# Patient Record
Sex: Male | Born: 1980 | Race: White | Hispanic: No | Marital: Married | Smoking: Never smoker
Health system: Southern US, Community
[De-identification: ages and names within clinical notes are randomized; demographics above are authoritative.]

## PROBLEM LIST (undated history)

## (undated) DIAGNOSIS — M359 Systemic involvement of connective tissue, unspecified: Secondary | ICD-10-CM

## (undated) DIAGNOSIS — M109 Gout, unspecified: Secondary | ICD-10-CM

## (undated) HISTORY — PX: ELBOW SURGERY: SHX618

## (undated) HISTORY — PX: ABDOMINAL SURGERY: SHX537

---

## 2008-08-31 ENCOUNTER — Ambulatory Visit: Payer: Self-pay | Admitting: Podiatry

## 2012-04-03 ENCOUNTER — Emergency Department: Payer: Self-pay | Admitting: *Deleted

## 2013-09-20 ENCOUNTER — Encounter: Payer: Self-pay | Admitting: Internal Medicine

## 2013-10-02 ENCOUNTER — Ambulatory Visit: Payer: Self-pay | Admitting: Internal Medicine

## 2014-08-18 ENCOUNTER — Ambulatory Visit: Payer: Self-pay | Admitting: Surgery

## 2017-05-14 ENCOUNTER — Emergency Department
Admission: EM | Admit: 2017-05-14 | Discharge: 2017-05-14 | Disposition: A | Discharge status: Home / Self Care | Payer: 59 | Attending: Emergency Medicine | Admitting: Emergency Medicine

## 2017-05-14 ENCOUNTER — Emergency Department: Payer: 59

## 2017-05-14 DIAGNOSIS — R110 Nausea: Secondary | ICD-10-CM | POA: Insufficient documentation

## 2017-05-14 DIAGNOSIS — Z79899 Other long term (current) drug therapy: Secondary | ICD-10-CM | POA: Diagnosis not present

## 2017-05-14 DIAGNOSIS — R1013 Epigastric pain: Secondary | ICD-10-CM | POA: Insufficient documentation

## 2017-05-14 DIAGNOSIS — R1010 Upper abdominal pain, unspecified: Secondary | ICD-10-CM

## 2017-05-14 HISTORY — DX: Gout, unspecified: M10.9

## 2017-05-14 LAB — COMPREHENSIVE METABOLIC PANEL
ALT: 38 U/L (ref 17–63)
AST: 24 U/L (ref 15–41)
Albumin: 4.3 g/dL (ref 3.5–5.0)
Alkaline Phosphatase: 69 U/L (ref 38–126)
Anion gap: 8 (ref 5–15)
BUN: 12 mg/dL (ref 6–20)
CHLORIDE: 107 mmol/L (ref 101–111)
CO2: 26 mmol/L (ref 22–32)
CREATININE: 0.78 mg/dL (ref 0.61–1.24)
Calcium: 9.6 mg/dL (ref 8.9–10.3)
GFR calc non Af Amer: >60 mL/min (ref >60)
Glucose, Bld: 101 mg/dL — ABNORMAL HIGH (ref 65–99)
Potassium: 4 mmol/L (ref 3.5–5.1)
SODIUM: 141 mmol/L (ref 135–145)
Total Bilirubin: 0.6 mg/dL (ref 0.3–1.2)
Total Protein: 7.3 g/dL (ref 6.5–8.1)

## 2017-05-14 LAB — CBC
HCT: 46 % (ref 40.0–52.0)
HEMOGLOBIN: 16.3 g/dL (ref 13.0–18.0)
MCH: 33.1 pg (ref 26.0–34.0)
MCHC: 35.4 g/dL (ref 32.0–36.0)
MCV: 93.6 fL (ref 80.0–100.0)
PLATELETS: 335 10*3/uL (ref 150–440)
RBC: 4.92 MIL/uL (ref 4.40–5.90)
RDW: 13.5 % (ref 11.5–14.5)
WBC: 6.1 10*3/uL (ref 3.8–10.6)

## 2017-05-14 LAB — LIPASE, BLOOD: Lipase: 30 U/L (ref 11–51)

## 2017-05-14 LAB — TROPONIN I: Troponin I: <0.03 ng/mL (ref <0.03)

## 2017-05-14 MED ORDER — IOPAMIDOL (ISOVUE-300) INJECTION 61%
100.0000 mL | Freq: Once | INTRAVENOUS | Status: AC | PRN
  Administered 2017-05-14: 100 mL via INTRAVENOUS

## 2017-05-14 MED ORDER — OMEPRAZOLE 40 MG PO CPDR: 40.0000 mg | DELAYED_RELEASE_CAPSULE | Freq: Every day | ORAL | 0 refills | Status: AC

## 2017-05-14 MED ORDER — CVS PROBIOTIC (LACTOBACILLUS) PO CAPS: 1.0000 | ORAL_CAPSULE | Freq: Every day | ORAL | 0 refills | Status: DC

## 2017-05-14 MED ORDER — IOPAMIDOL (ISOVUE-300) INJECTION 61%
30.0000 mL | Freq: Once | INTRAVENOUS | Status: AC | PRN
  Administered 2017-05-14: 30 mL via ORAL

## 2017-05-14 NOTE — ED Notes (Signed)
Pt returned from CT, resting in bed in no distress 

## 2017-05-14 NOTE — ED Triage Notes (Signed)
Pt c/o epigastric pain intermittently since July, bloating, heartburn, indigestion. Denies NVD. Pt alert and oriented X4, active, cooperative, pt in NAD. RR even and unlabored, color WNL.

## 2017-05-14 NOTE — ED Provider Notes (Signed)
[facility] Emergency Department Provider Note ____________________________________________   First MD Initiated Contact with Patient 05/14/17 1010     (approximate)  I have reviewed the triage vital signs and the nursing notes.   HISTORY  Chief Complaint Abdominal Pain    HPI Kevin Davidson is a 36 y.o. male with a past history of gout who presents with left sided and epigastric abdominal pain for the last 3 months, starting after he had an appendectomy on July 6, and intermittent.  It is worse after eating and associated with feeling very full after he eats even a small amount and also associated with constipation although he states he had a normal bowel movement this morning. Patient reports intermittent nausea but no vomiting. He denies fever or chills, diarrhea, or any urinary symptoms. No swelling of the abdomen. Patient is passing gas normally.  Past Medical History:  Diagnosis Date  . Gout     There are no active problems to display for this patient.   Past Surgical History:  Procedure Laterality Date  . ABDOMINAL SURGERY    . ELBOW SURGERY      Prior to Admission medications   Medication Sig Start Date End Date Taking? Authorizing Provider  allopurinol (ZYLOPRIM) 300 MG tablet Take 300 mg by mouth daily. 04/04/17  Yes [provider]  HYDROcodone-acetaminophen (NORCO/VICODIN) 5-325 MG tablet Take 1 tablet by mouth every 4 (four) hours as needed. 03/09/17   [provider]  Lactobacillus Rhamnosus, GG, ([employer] PROBIOTIC, LACTOBACILLUS,) CAPS Take 1-2 capsules by mouth daily. 05/14/17   Arta Silence, MD  omeprazole (PRILOSEC) 40 MG capsule Take 1 capsule (40 mg total) by mouth daily. 05/14/17 05/14/18  Arta Silence, MD    Allergies Patient has no known allergies.  No family history on file.  Social History Social History  Substance Use Topics  . Smoking status: Never Smoker  . Smokeless tobacco: Not on file  .  Alcohol use No    Review of Systems  Constitutional: No fever/chills Eyes: No visual changes. ENT: No sore throat. Cardiovascular: Denies chest pain. Respiratory: Denies shortness of breath. Gastrointestinal: Positive for nausea, no vomiting.  No diarrhea.  Genitourinary: Negative for dysuria.  Musculoskeletal: Negative for back pain. Skin: Negative for rash. Neurological: Negative for headaches, focal weakness or numbness.   ____________________________________________   PHYSICAL EXAM:  VITAL SIGNS: ED Triage Vitals  Enc Vitals Group     BP 05/14/17 0848 126/84     Pulse Rate 05/14/17 0848 67     Resp 05/14/17 0848 16     Temp 05/14/17 0848 97.9 F (36.6 C)     Temp Source 05/14/17 0848 Oral     SpO2 05/14/17 0848 98 %     Weight 05/14/17 0849 238 lb (108 kg)     Height 05/14/17 0849 6' (1.829 m)     Head Circumference --      Peak Flow --      Pain Score 05/14/17 0848 5     Pain Loc --      Pain Edu? --      Excl. in [place]? --     Constitutional: Alert and oriented. Well appearing and in no acute distress. Eyes: Conjunctivae are normal.  Head: Atraumatic. Nose: No congestion/rhinnorhea. Mouth/Throat: Mucous membranes are moist.   Neck: Normal range of motion.  Cardiovascular: Good peripheral circulation. Respiratory: Normal respiratory effort.  No retractions.  Gastrointestinal: Soft and nontender. Minimal LUQ discomfort to deep palpation. No distention.  Genitourinary: No CVA tenderness. Musculoskeletal:  Extremities warm and well perfused.  Neurologic:  Normal speech and language. No gross focal neurologic deficits are appreciated.  Skin:  Skin is warm and dry. No rash noted. Psychiatric: Mood and affect are normal. Speech and behavior are normal.  ____________________________________________   LABS (all labs ordered are listed, but only abnormal results are displayed)  Labs Reviewed  COMPREHENSIVE METABOLIC PANEL - Abnormal; Notable for the following:        Result Value   Glucose, Bld 101 (*)    All other components within normal limits  LIPASE, BLOOD  CBC  TROPONIN I  URINALYSIS, COMPLETE (UACMP) WITH MICROSCOPIC   ____________________________________________  EKG  ED ECG REPORT I, Arta Silence, the attending physician, personally viewed and interpreted this ECG.  Date: 05/14/2017 EKG Time: 849 Rate: 63 Rhythm: normal sinus rhythm QRS Axis: normal Intervals: normal ST/T Wave abnormalities: normal Narrative Interpretation: no evidence of acute ischemia  ____________________________________________  RADIOLOGY  CT abd with no SBO or other acute findings.    ____________________________________________   PROCEDURES  Procedure(s) performed: No    Critical Care performed: No ____________________________________________   INITIAL IMPRESSION / ASSESSMENT AND PLAN / ED COURSE  Pertinent labs & imaging results that were available during my care of the patient were reviewed by me and considered in my medical decision making (see chart for details).  36 year old male presents with 3 months of intermittent left upper quadrant abdominal pain starting after he had an appendectomy. Patient reports feeling early satiety and has had some constipation although reports normal bowel movement today. Vital signs are normal, patient is comfortable appearing, and exam is as described with minimal left upper quadrant discomfort to deep palpation. Overall suspect most likely gastritis versus PUD, and some of the symptoms also may be due to chronic constipation or changes in gut flora after the surgery and antibiotics. There is no clinical evidence for SBO or other acute postsurgical complication. I had extensive discussion with patient; he is very concerned about complications from the surgery and based on shared decision making he would strongly prefer to obtain a CT to definitively rule out any such acute cause of his pain. If CT  negative anticipate discharge home with GI referral.    ----------------------------------------- 12:31 PM on 05/14/2017 -----------------------------------------  CT is negative. Patient continues to appear comfortable. I will discharge with PPI as well as probiotics. Patient given return precautions and instructed to follow-up with a gastroenterologist. Patient feels comfortable with the discharge plan and feels well to go home.  ____________________________________________   FINAL CLINICAL IMPRESSION(S) / ED DIAGNOSES  Final diagnoses:  Pain of upper abdomen      NEW MEDICATIONS STARTED DURING THIS VISIT:  New Prescriptions   LACTOBACILLUS RHAMNOSUS, GG, ([employer] PROBIOTIC, LACTOBACILLUS,) CAPS    Take 1-2 capsules by mouth daily.   OMEPRAZOLE (PRILOSEC) 40 MG CAPSULE    Take 1 capsule (40 mg total) by mouth daily.     Note:  This document was prepared using Dragon voice recognition software and may include unintentional dictation errors.    Arta Silence, MD 05/14/17 1236

## 2017-05-14 NOTE — ED Notes (Signed)
EDP at bedside  

## 2017-05-14 NOTE — Discharge Instructions (Signed)
Return to the ER for new or worsening abdominal pain, constant abdominal pain, persistent vomiting or inability to tolerate anything by mouth, fever or chills, not having bowel movements or passing gas, distention of your abdomen, or any other new or worsening symptoms that concern you. Follow-up with a gastroenterologist within the next several weeks.

## 2017-08-23 ENCOUNTER — Other Ambulatory Visit: Payer: Self-pay | Admitting: Physician Assistant

## 2017-08-23 DIAGNOSIS — R748 Abnormal levels of other serum enzymes: Secondary | ICD-10-CM

## 2017-09-03 ENCOUNTER — Ambulatory Visit
Admission: RE | Admit: 2017-09-03 | Discharge: 2017-09-03 | Disposition: A | Discharge status: Home / Self Care | Payer: 59 | Source: Ambulatory Visit | Attending: Physician Assistant | Admitting: Physician Assistant

## 2017-09-03 DIAGNOSIS — R748 Abnormal levels of other serum enzymes: Secondary | ICD-10-CM | POA: Insufficient documentation

## 2017-09-03 DIAGNOSIS — R109 Unspecified abdominal pain: Secondary | ICD-10-CM | POA: Diagnosis not present

## 2017-09-03 DIAGNOSIS — R932 Abnormal findings on diagnostic imaging of liver and biliary tract: Secondary | ICD-10-CM | POA: Insufficient documentation

## 2017-09-03 DIAGNOSIS — K7689 Other specified diseases of liver: Secondary | ICD-10-CM | POA: Diagnosis not present

## 2018-12-11 ENCOUNTER — Encounter: Payer: Self-pay | Admitting: Emergency Medicine

## 2018-12-11 ENCOUNTER — Emergency Department
Admission: EM | Admit: 2018-12-11 | Discharge: 2018-12-11 | Disposition: A | Discharge status: Home / Self Care | Payer: 59 | Attending: Emergency Medicine | Admitting: Emergency Medicine

## 2018-12-11 ENCOUNTER — Other Ambulatory Visit: Payer: Self-pay

## 2018-12-11 DIAGNOSIS — E8809 Other disorders of plasma-protein metabolism, not elsewhere classified: Secondary | ICD-10-CM | POA: Diagnosis not present

## 2018-12-11 DIAGNOSIS — R202 Paresthesia of skin: Secondary | ICD-10-CM | POA: Diagnosis not present

## 2018-12-11 DIAGNOSIS — M791 Myalgia, unspecified site: Secondary | ICD-10-CM | POA: Insufficient documentation

## 2018-12-11 DIAGNOSIS — M79606 Pain in leg, unspecified: Secondary | ICD-10-CM | POA: Diagnosis present

## 2018-12-11 LAB — CBC WITH DIFFERENTIAL/PLATELET
Abs Immature Granulocytes: 0.04 10*3/uL (ref 0.00–0.07)
Basophils Absolute: 0 10*3/uL (ref 0.0–0.1)
Basophils Relative: 1 %
Eosinophils Absolute: 0.2 10*3/uL (ref 0.0–0.5)
Eosinophils Relative: 4 %
HCT: 44 % (ref 39.0–52.0)
Hemoglobin: 15.2 g/dL (ref 13.0–17.0)
Immature Granulocytes: 1 %
Lymphocytes Relative: 16 %
Lymphs Abs: 1 10*3/uL (ref 0.7–4.0)
MCH: 32.3 pg (ref 26.0–34.0)
MCHC: 34.5 g/dL (ref 30.0–36.0)
MCV: 93.6 fL (ref 80.0–100.0)
Monocytes Absolute: 0.6 10*3/uL (ref 0.1–1.0)
Monocytes Relative: 9 %
Neutro Abs: 4.5 10*3/uL (ref 1.7–7.7)
Neutrophils Relative %: 69 %
Platelets: 322 10*3/uL (ref 150–400)
RBC: 4.7 MIL/uL (ref 4.22–5.81)
RDW: 13 % (ref 11.5–15.5)
WBC: 6.3 10*3/uL (ref 4.0–10.5)
nRBC: 0 % (ref 0.0–0.2)

## 2018-12-11 LAB — COMPREHENSIVE METABOLIC PANEL
ALT: 36 U/L (ref 0–44)
AST: 39 U/L (ref 15–41)
Albumin: 3.3 g/dL — ABNORMAL LOW (ref 3.5–5.0)
Alkaline Phosphatase: 67 U/L (ref 38–126)
Anion gap: 7 (ref 5–15)
BUN: 10 mg/dL (ref 6–20)
CO2: 27 mmol/L (ref 22–32)
Calcium: 8.9 mg/dL (ref 8.9–10.3)
Chloride: 105 mmol/L (ref 98–111)
Creatinine, Ser: 0.82 mg/dL (ref 0.61–1.24)
GFR calc Af Amer: >60 mL/min (ref >60)
GFR calc non Af Amer: >60 mL/min (ref >60)
Glucose, Bld: 99 mg/dL (ref 70–99)
Potassium: 4 mmol/L (ref 3.5–5.1)
Sodium: 139 mmol/L (ref 135–145)
Total Bilirubin: 0.6 mg/dL (ref 0.3–1.2)
Total Protein: 6 g/dL — ABNORMAL LOW (ref 6.5–8.1)

## 2018-12-11 MED ORDER — TRAMADOL HCL 50 MG PO TABS: 50.0000 mg | ORAL_TABLET | Freq: Four times a day (QID) | ORAL | 0 refills | Status: AC | PRN

## 2018-12-11 NOTE — ED Provider Notes (Signed)
  [facility] Emergency Department Provider Note  ____________________________________________   First MD Initiated Contact with Patient 12/11/18 0931     (approximate)  I have reviewed the triage vital signs and the nursing notes.   HISTORY  Chief Complaint Leg Pain and Wrist Pain    HPI Kevin Davidson is a 38 y.o. male presents emergency department multiple vague complaints.  He states that he has had bilateral leg pain for about a month.  Tingling in the wrist for over 2 weeks.  States it is positional.  No known injury.  He does work outside a lot.  He denies any known tick bites.  He has been evaluated for these type symptoms by his PCP and his orthopedic doctor.    Past Medical History:  Diagnosis Date  . Gout     There are no active problems to display for this patient.   Past Surgical History:  Procedure Laterality Date  . ABDOMINAL SURGERY    . ELBOW SURGERY      Prior to Admission medications   Medication Sig Start Date End Date Taking? Authorizing Provider  allopurinol (ZYLOPRIM) 300 MG tablet Take 300 mg by mouth daily. 04/04/17   [provider]  omeprazole (PRILOSEC) 40 MG capsule Take 1 capsule (40 mg total) by mouth daily. 05/14/17 05/14/18  Siadecki, Sebastian, MD    Allergies Patient has no known allergies.  No family history on file.  Social History Social History   Tobacco Use  . Smoking status: Never Smoker  Substance Use Topics  . Alcohol use: No  . Drug use: No    Review of Systems  Constitutional: No fever/chills Eyes: No visual changes. ENT: No sore throat. Respiratory: Denies cough Genitourinary: Negative for dysuria. Musculoskeletal: Negative for back pain.  Positive for wrist and leg pain Skin: Negative for rash.    ____________________________________________   PHYSICAL EXAM:  VITAL SIGNS: ED Triage Vitals  Enc Vitals Group     BP 12/11/18 0910 109/69     Pulse Rate 12/11/18 0910 74      Resp 12/11/18 0910 18     Temp 12/11/18 0910 98 F (36.7 C)     Temp Source 12/11/18 0910 Oral     SpO2 12/11/18 0910 96 %     Weight --      Height --      Head Circumference --      Peak Flow --      Pain Score 12/11/18 0905 7     Pain Loc --      Pain Edu? --      Excl. in [place]? --     Constitutional: Alert and oriented. Well appearing and in no acute distress. Eyes: Conjunctivae are normal.  Head: Atraumatic. Nose: No congestion/rhinnorhea. Mouth/Throat: Mucous membranes are moist.   Neck:  supple no lymphadenopathy noted Cardiovascular: Normal rate, regular rhythm. Heart sounds are normal Respiratory: Normal respiratory effort.  No retractions, lungs c t a  GU: deferred Musculoskeletal: FROM all extremities, warm and well perfused, legs are mildly tender to palpation bilaterally, wrist appear normal.  No swelling is noted.  Negative Phalen's and Tinel sign. Neurologic:  Normal speech and language.  Skin:  Skin is warm, dry and intact. No rash noted. Psychiatric: Mood and affect are normal. Speech and behavior are normal.  ____________________________________________   LABS (all labs ordered are listed, but only abnormal results are displayed)  Labs Reviewed  COMPREHENSIVE METABOLIC PANEL - Abnormal; Notable for   the following components:      Result Value   Total Protein 6.0 (*)    Albumin 3.3 (*)    All other components within normal limits  CBC WITH DIFFERENTIAL/PLATELET   ____________________________________________   ____________________________________________  RADIOLOGY    ____________________________________________   PROCEDURES  Procedure(s) performed: No  Procedures    ____________________________________________   INITIAL IMPRESSION / ASSESSMENT AND PLAN / ED COURSE  Pertinent labs & imaging results that were available during my care of the patient were reviewed by me and considered in my medical decision making (see chart for  details).   Patient is 38-year-old male presents emergency department for complaints of leg and wrist pain.  Patient has been eating a diet heavy and pork products.    Both legs are mildly tender bilaterally, no redness or swelling is noted, wrist do not appear to be tender, carpal tunnel test appear to be negative, C-spine is nontender, remainder of exam is unremarkable   Due to the vague complaints CBC and met C were ordered  CBC is normal, metabolic panel shows low albumin and protein  Discussed these findings with patient.  Explained this can cause some myalgias.  Explained to him that low albumin can be caused by liver problems.  Patient does have a history of gout so this would contribute to the findings.  Patient is also eating a poor diet so instructed him to clean up his diet and eat leaner meats.  He is to follow-up with his regular doctor to have his albumin retested in 2 weeks.  I instructed him to also ask for a Lyme's test.  He should follow-up with orthopedics for the tingling sensation in the wrist which is positional.  He was given a prescription for tramadol due to the pain keeping him awake at night.  He was discharged in stable condition.  As part of my medical decision making, I reviewed the following data within the electronic medical record:  Nursing notes reviewed and incorporated, Labs reviewed CBC is normal, metabolic panel shows low albumin and protein, Old chart reviewed, Notes from prior ED visits and [place] Controlled Substance Database  ____________________________________________   FINAL CLINICAL IMPRESSION(S) / ED DIAGNOSES  Final diagnoses:  Hypoalbuminemia  Paresthesias  Muscle ache      NEW MEDICATIONS STARTED DURING THIS VISIT:  Current Discharge Medication List       Note:  This document was prepared using Dragon voice recognition software and may include unintentional dictation errors.    Fisher, Susan W, PA-C 12/11/18 1101    Veronese,  Granjeno, MD 12/11/18 1400  

## 2018-12-11 NOTE — Discharge Instructions (Signed)
Follow-up with your regular doctor for recheck of your albumin level and request a Lyme's test.  You need to follow-up with orthopedics and your primary care doctor.

## 2018-12-11 NOTE — ED Triage Notes (Signed)
Pt to ED via POV c/o bilateral wrist pain and swelling and bilateral leg pain x 1 month. Pt states that pain is so bad that it hurts to stand. Pt is in NAD.

## 2018-12-11 NOTE — ED Notes (Signed)
AAOx3.  Skin warm and dry.  NAD 

## 2020-05-07 ENCOUNTER — Other Ambulatory Visit: Payer: Self-pay

## 2020-05-07 ENCOUNTER — Ambulatory Visit
Admission: RE | Admit: 2020-05-07 | Discharge: 2020-05-07 | Disposition: A | Discharge status: Home / Self Care | Payer: No Typology Code available for payment source | Source: Ambulatory Visit | Attending: Physician Assistant | Admitting: Physician Assistant

## 2020-05-07 ENCOUNTER — Other Ambulatory Visit: Payer: Self-pay | Admitting: Physician Assistant

## 2020-05-07 ENCOUNTER — Encounter (INDEPENDENT_AMBULATORY_CARE_PROVIDER_SITE_OTHER): Payer: Self-pay

## 2020-05-07 ENCOUNTER — Other Ambulatory Visit (HOSPITAL_COMMUNITY): Payer: Self-pay | Admitting: Physician Assistant

## 2020-05-07 DIAGNOSIS — R079 Chest pain, unspecified: Secondary | ICD-10-CM | POA: Diagnosis present

## 2020-05-07 HISTORY — DX: Systemic involvement of connective tissue, unspecified: M35.9

## 2020-05-07 MED ORDER — IOHEXOL 350 MG/ML SOLN
100.0000 mL | Freq: Once | INTRAVENOUS | Status: AC | PRN
  Administered 2020-05-07: 12:00:00 100 mL via INTRAVENOUS

## 2020-06-11 ENCOUNTER — Ambulatory Visit
Admission: RE | Admit: 2020-06-11 | Discharge: 2020-06-11 | Disposition: A | Discharge status: Home / Self Care | Payer: No Typology Code available for payment source | Attending: Family Medicine | Admitting: Family Medicine

## 2020-06-11 ENCOUNTER — Ambulatory Visit
Admission: RE | Admit: 2020-06-11 | Discharge: 2020-06-11 | Disposition: A | Discharge status: Home / Self Care | Payer: No Typology Code available for payment source | Source: Ambulatory Visit | Attending: Family Medicine | Admitting: Family Medicine

## 2020-06-11 ENCOUNTER — Other Ambulatory Visit: Payer: Self-pay | Admitting: Physician Assistant

## 2020-06-11 ENCOUNTER — Other Ambulatory Visit: Payer: Self-pay | Admitting: Family Medicine

## 2020-06-11 ENCOUNTER — Other Ambulatory Visit: Payer: Self-pay

## 2020-06-11 DIAGNOSIS — U071 COVID-19: Secondary | ICD-10-CM

## 2020-06-11 DIAGNOSIS — R109 Unspecified abdominal pain: Secondary | ICD-10-CM

## 2020-06-11 DIAGNOSIS — K5900 Constipation, unspecified: Secondary | ICD-10-CM

## 2020-06-13 ENCOUNTER — Ambulatory Visit
Admission: RE | Admit: 2020-06-13 | Discharge: 2020-06-13 | Disposition: A | Discharge status: Home / Self Care | Payer: No Typology Code available for payment source | Source: Ambulatory Visit | Attending: Physician Assistant | Admitting: Physician Assistant

## 2020-06-13 ENCOUNTER — Other Ambulatory Visit: Payer: Self-pay

## 2020-06-13 DIAGNOSIS — R109 Unspecified abdominal pain: Secondary | ICD-10-CM | POA: Diagnosis present

## 2021-02-13 ENCOUNTER — Other Ambulatory Visit: Payer: Self-pay

## 2021-02-13 ENCOUNTER — Emergency Department
Admission: EM | Admit: 2021-02-13 | Discharge: 2021-02-14 | Disposition: A | Discharge status: Home / Self Care | Payer: No Typology Code available for payment source | Attending: Emergency Medicine | Admitting: Emergency Medicine

## 2021-02-13 ENCOUNTER — Emergency Department: Payer: No Typology Code available for payment source

## 2021-02-13 ENCOUNTER — Encounter: Payer: Self-pay | Admitting: *Deleted

## 2021-02-13 DIAGNOSIS — Z79899 Other long term (current) drug therapy: Secondary | ICD-10-CM | POA: Insufficient documentation

## 2021-02-13 DIAGNOSIS — R109 Unspecified abdominal pain: Secondary | ICD-10-CM | POA: Insufficient documentation

## 2021-02-13 DIAGNOSIS — M545 Low back pain, unspecified: Secondary | ICD-10-CM | POA: Insufficient documentation

## 2021-02-13 DIAGNOSIS — X501XXA Overexertion from prolonged static or awkward postures, initial encounter: Secondary | ICD-10-CM | POA: Diagnosis not present

## 2021-02-13 LAB — COMPREHENSIVE METABOLIC PANEL
ALT: 40 U/L (ref 0–44)
AST: 32 U/L (ref 15–41)
Albumin: 4.1 g/dL (ref 3.5–5.0)
Alkaline Phosphatase: 61 U/L (ref 38–126)
Anion gap: 11 (ref 5–15)
BUN: 13 mg/dL (ref 6–20)
CO2: 22 mmol/L (ref 22–32)
Calcium: 9.3 mg/dL (ref 8.9–10.3)
Chloride: 103 mmol/L (ref 98–111)
Creatinine, Ser: 0.76 mg/dL (ref 0.61–1.24)
GFR, Estimated: >60 mL/min (ref >60)
Glucose, Bld: 120 mg/dL — ABNORMAL HIGH (ref 70–99)
Potassium: 4 mmol/L (ref 3.5–5.1)
Sodium: 136 mmol/L (ref 135–145)
Total Bilirubin: 0.7 mg/dL (ref 0.3–1.2)
Total Protein: 6.9 g/dL (ref 6.5–8.1)

## 2021-02-13 LAB — CBC
HCT: 43.9 % (ref 39.0–52.0)
Hemoglobin: 15.5 g/dL (ref 13.0–17.0)
MCH: 34 pg (ref 26.0–34.0)
MCHC: 35.3 g/dL (ref 30.0–36.0)
MCV: 96.3 fL (ref 80.0–100.0)
Platelets: 267 10*3/uL (ref 150–400)
RBC: 4.56 MIL/uL (ref 4.22–5.81)
RDW: 13.2 % (ref 11.5–15.5)
WBC: 7.3 10*3/uL (ref 4.0–10.5)
nRBC: 0 % (ref 0.0–0.2)

## 2021-02-13 LAB — URINALYSIS, COMPLETE (UACMP) WITH MICROSCOPIC
Bacteria, UA: NONE SEEN
Bilirubin Urine: NEGATIVE
Glucose, UA: NEGATIVE mg/dL
Hgb urine dipstick: NEGATIVE
Ketones, ur: NEGATIVE mg/dL
Leukocytes,Ua: NEGATIVE
Nitrite: NEGATIVE
Protein, ur: NEGATIVE mg/dL
Specific Gravity, Urine: 1.012 (ref 1.005–1.030)
Squamous Epithelial / LPF: NONE SEEN (ref 0–5)
pH: 6 (ref 5.0–8.0)

## 2021-02-13 MED ORDER — IBUPROFEN 800 MG PO TABS: 800.0000 mg | ORAL_TABLET | Freq: Three times a day (TID) | ORAL | 0 refills | Status: AC | PRN

## 2021-02-13 MED ORDER — METHOCARBAMOL 500 MG PO TABS: 500.0000 mg | ORAL_TABLET | Freq: Three times a day (TID) | ORAL | 0 refills | Status: AC | PRN

## 2021-02-13 NOTE — ED Triage Notes (Signed)
Pt has right side flank pain .  Intermittent nausea.  Sx for 1 week.  No hx kidney stones  pt alert speech clear.

## 2021-02-13 NOTE — ED Provider Notes (Signed)
[facility] Emergency Department Provider Note  ____________________________________________   Event Date/Time   First MD Initiated Contact with Patient 02/13/21 2323     (approximate)  I have reviewed the triage vital signs and the nursing notes.   HISTORY  Chief Complaint Flank Pain    HPI Kevin Davidson is a 40 y.o. male with history of gout who presents to the emergency department complaints of right flank pain that is sharp, severe in nature.  Pain worse with sitting in his car and driving, certain positions and movement.  He states that pain has been ongoing for about a week.  No injury to his back that he can recall.  No falls or lifting anything heavy.  Denies numbness, tingling or weakness.  No bowel or bladder incontinence.  No urinary retention but does describe feeling like he is not urinating as much as he normally does.  Denies dysuria, hematuria.  No fevers, vomiting, diarrhea, abdominal pain.  No history of kidney stones but states he does have a family history of kidney stones.  States he had similar pain about a month ago that was severe in nature and came on suddenly but then spontaneously resolved.  Denies history of back surgeries, epidural injections.  No history of HIV, diabetes or other immunocompromise state.  No history of IV drug abuse, cancer.  Patient has had previous appendectomy.        Past Medical History:  Diagnosis Date   Collagen vascular disease ([place])    Gout     There are no problems to display for this patient.   Past Surgical History:  Procedure Laterality Date   ABDOMINAL SURGERY     ELBOW SURGERY      Prior to Admission medications   Medication Sig Start Date End Date Taking? Authorizing Provider  ibuprofen (ADVIL) 800 MG tablet Take 1 tablet (800 mg total) by mouth every 8 (eight) hours as needed for mild pain. 02/13/21  Yes Karcyn Menn, Delice Bison, DO  methocarbamol (ROBAXIN) 500 MG tablet Take 1 tablet (500 mg  total) by mouth every 8 (eight) hours as needed for muscle spasms. 02/13/21  Yes Sonya Pucci, Delice Bison, DO  allopurinol (ZYLOPRIM) 300 MG tablet Take 300 mg by mouth daily. 04/04/17   [provider]  omeprazole (PRILOSEC) 40 MG capsule Take 1 capsule (40 mg total) by mouth daily. 05/14/17 05/14/18  Arta Silence, MD  traMADol (ULTRAM) 50 MG tablet Take 1 tablet (50 mg total) by mouth every 6 (six) hours as needed. 12/11/18   Versie Starks, PA-C    Allergies Patient has no known allergies.  No family history on file.  Social History Social History   Tobacco Use   Smoking status: Never   Smokeless tobacco: Never  Substance Use Topics   Alcohol use: No   Drug use: No    Review of Systems Constitutional: No fever. Eyes: No visual changes. ENT: No sore throat. Cardiovascular: Denies chest pain. Respiratory: Denies shortness of breath. Gastrointestinal: No nausea, vomiting, diarrhea. Genitourinary: Negative for dysuria. Musculoskeletal: + for back pain. Skin: Negative for rash. Neurological: Negative for focal weakness or numbness.  ____________________________________________   PHYSICAL EXAM:  VITAL SIGNS: ED Triage Vitals  Enc Vitals Group     BP 02/13/21 2010 (!) 120/100     Pulse Rate 02/13/21 2010 81     Resp 02/13/21 2010 20     Temp 02/13/21 2010 98.8 F (37.1 C)     Temp Source  02/13/21 2010 Oral     SpO2 02/13/21 2010 99 %     Weight 02/13/21 2011 238 lb (108 kg)     Height 02/13/21 2011 6' (1.829 m)     Head Circumference --      Peak Flow --      Pain Score 02/13/21 2011 9     Pain Loc --      Pain Edu? --      Excl. in [place]? --    CONSTITUTIONAL: Alert and oriented and responds appropriately to questions. Well-appearing; well-nourished HEAD: Normocephalic EYES: Conjunctivae clear, pupils appear equal, EOM appear intact ENT: normal nose; moist mucous membranes NECK: Supple, normal ROM CARD: RRR; S1 and S2 appreciated; no murmurs, no clicks, no  rubs, no gallops RESP: Normal chest excursion without splinting or tachypnea; breath sounds clear and equal bilaterally; no wheezes, no rhonchi, no rales, no hypoxia or respiratory distress, speaking full sentences ABD/GI: Normal bowel sounds; non-distended; soft, non-tender, no rebound, no guarding, no peritoneal signs, no hepatosplenomegaly BACK: The back appears normal, no midline spinal tenderness or step-off or deformity, minimally tender to palpation over the right lumbar paraspinal muscles without redness, warmth, ecchymosis, soft tissue swelling, rash or other lesions, no CVA tenderness EXT: Normal ROM in all joints; no deformity noted, no edema; no cyanosis SKIN: Normal color for age and race; warm; no rash on exposed skin NEURO: Moves all extremities equally, normal sensation diffusely, no saddle anesthesia, normal gait, normal speech, no facial asymmetry PSYCH: The patient's mood and manner are appropriate.  ____________________________________________   LABS (all labs ordered are listed, but only abnormal results are displayed)  Labs Reviewed  COMPREHENSIVE METABOLIC PANEL - Abnormal; Notable for the following components:      Result Value   Glucose, Bld 120 (*)    All other components within normal limits  URINALYSIS, COMPLETE (UACMP) WITH MICROSCOPIC - Abnormal; Notable for the following components:   Color, Urine YELLOW (*)    APPearance CLEAR (*)    All other components within normal limits  CBC   ____________________________________________  EKG   ____________________________________________  RADIOLOGY I, Larry Alcock, personally viewed and evaluated these images (plain radiographs) as part of my medical decision making, as well as reviewing the written report by the radiologist.  ED MD interpretation: CT scan shows no acute abnormality.  Official radiology report(s): CT Renal Stone Study  Result Date: 02/13/2021 CLINICAL DATA:  Right flank pain EXAM: CT  ABDOMEN AND PELVIS WITHOUT CONTRAST TECHNIQUE: Multidetector CT imaging of the abdomen and pelvis was performed following the standard protocol without IV contrast. COMPARISON:  06/13/2020 FINDINGS: Lower chest: Lung bases are clear. No effusions. Heart is normal size. Hepatobiliary: No focal hepatic abnormality. Gallbladder unremarkable. Pancreas: No focal abnormality or ductal dilatation. Spleen: No focal abnormality.  Normal size. Adrenals/Urinary Tract: No adrenal abnormality. No focal renal abnormality. No stones or hydronephrosis. Urinary bladder is unremarkable. Stomach/Bowel: Prior appendectomy. Stomach, large and small bowel grossly unremarkable. Vascular/Lymphatic: No evidence of aneurysm or adenopathy. Reproductive: No visible focal abnormality. Other: No free fluid or free air. Musculoskeletal: No acute bony abnormality. IMPRESSION: No renal or ureteral stones.  No hydronephrosis. No acute findings. Electronically Signed   By: Rolm Baptise M.D.   On: 02/13/2021 23:46    ____________________________________________   PROCEDURES  Procedure(s) performed (including Critical Care):  Procedures    ____________________________________________   INITIAL IMPRESSION / ASSESSMENT AND PLAN / ED COURSE  As part of my medical decision making, I reviewed the  following data within the [place] notes reviewed and incorporated, Labs reviewed , Old chart reviewed, Radiograph reviewed , Notes from prior ED visits, and [place] Controlled Substance Database         Patient here with right lower back pain.  Discussed with patient that this may be musculoskeletal in nature given worsening with movement and positions and it is somewhat reproducible with palpation however he does report sudden onset without inciting event, family history of kidney stones and difficulty urinating but denies incontinence or retention.  Labs, urine obtained in triage are unremarkable.  We will proceed  with CT imaging to evaluate for possible kidney stone.  Doubt UTI, pyelonephritis, appendicitis.  Patient declines pain medication at this time.  ED PROGRESS  Patient CT scan shows no acute abnormality.  No kidney stones, stranding, hydronephrosis.  He has had a previous appendectomy.  No bony abnormality noted to the spine.  Discussed with patient that I suspect this is muscle spasm, strain and have recommended anti-inflammatories, muscle relaxers.  Patient comfortable with this plan.  Will discharge.  At this time, I do not feel there is any life-threatening condition present. I have reviewed, interpreted and discussed all results (EKG, imaging, lab, urine as appropriate) and exam findings with patient/family. I have reviewed nursing notes and appropriate previous records.  I feel the patient is safe to be discharged home without further emergent workup and can continue workup as an outpatient as needed. Discussed usual and customary return precautions. Patient/family verbalize understanding and are comfortable with this plan.  Outpatient follow-up has been provided as needed. All questions have been answered.  ____________________________________________   FINAL CLINICAL IMPRESSION(S) / ED DIAGNOSES  Final diagnoses:  Acute right-sided low back pain without sciatica     ED Discharge Orders          Ordered    ibuprofen (ADVIL) 800 MG tablet  Every 8 hours PRN        02/13/21 2356    methocarbamol (ROBAXIN) 500 MG tablet  Every 8 hours PRN        02/13/21 2356            *Please note:  Kevin Davidson was evaluated in Emergency Department on 02/13/2021 for the symptoms described in the history of present illness. He was evaluated in the context of the global COVID-19 pandemic, which necessitated consideration that the patient might be at risk for infection with the SARS-CoV-2 virus that causes COVID-19. Institutional protocols and algorithms that pertain to the evaluation of patients  at risk for COVID-19 are in a state of rapid change based on information released by regulatory bodies including the CDC and federal and state organizations. These policies and algorithms were followed during the patient's care in the ED.  Some ED evaluations and interventions may be delayed as a result of limited staffing during and the pandemic.*   Note:  This document was prepared using Dragon voice recognition software and may include unintentional dictation errors.    Malisha Mabey, Delice Bison, DO 02/13/21 2357

## 2021-02-13 NOTE — Discharge Instructions (Addendum)
You may alternate Tylenol 1000 mg every 6 hours as needed for pain, fever and Ibuprofen 800 mg every 8 hours as needed for pain, fever.  Please take Ibuprofen with food.  Do not take more than 4000 mg of Tylenol (acetaminophen) in a 24 hour period.  You may alternate heat and ice to this area to help with your back pain.  Your labs, urine, CT scan showed no acute abnormality today.  I suspect your symptoms are secondary to muscle strain, spasm.  I recommend avoid lifting anything over 10 pounds for the next several days.  Please follow-up with your primary care physician if symptoms or not improving.

## 2022-01-08 ENCOUNTER — Ambulatory Visit: Payer: No Typology Code available for payment source | Admitting: Dermatology

## 2022-01-08 DIAGNOSIS — L578 Other skin changes due to chronic exposure to nonionizing radiation: Secondary | ICD-10-CM | POA: Diagnosis not present

## 2022-01-08 DIAGNOSIS — D225 Melanocytic nevi of trunk: Secondary | ICD-10-CM

## 2022-01-08 DIAGNOSIS — L918 Other hypertrophic disorders of the skin: Secondary | ICD-10-CM

## 2022-01-08 DIAGNOSIS — D229 Melanocytic nevi, unspecified: Secondary | ICD-10-CM | POA: Diagnosis not present

## 2022-01-08 DIAGNOSIS — D485 Neoplasm of uncertain behavior of skin: Secondary | ICD-10-CM | POA: Diagnosis not present

## 2022-01-08 DIAGNOSIS — D492 Neoplasm of unspecified behavior of bone, soft tissue, and skin: Secondary | ICD-10-CM

## 2022-01-08 DIAGNOSIS — L814 Other melanin hyperpigmentation: Secondary | ICD-10-CM

## 2022-01-08 NOTE — Progress Notes (Signed)
New Patient Visit  Subjective  Kevin Davidson is a 41 y.o. male who presents for the following: check spot  (Back, pts wife noticed, itchy and stings/L arm 21yr, no symptoms) and Skin Tag (Neck, yrs). The patient has spots, moles and lesions to be evaluated, some may be new or changing and the patient has concerns that these could be cancer.  The following portions of the chart were reviewed this encounter and updated as appropriate:   Tobacco  Allergies  Meds  Problems  Med Hx  Surg Hx  Fam Hx     Review of Systems:  No other skin or systemic complaints except as noted in HPI or Assessment and Plan.  Objective  Well appearing patient in no apparent distress; mood and affect are within normal limits.  A focused examination was performed including back,, left arm, neck. Relevant physical exam findings are noted in the Assessment and Plan.  Left Upper Back paraspinal 1.1cm irregular brown macule       L mid back paraspinal 0.8cm irregular brown macule       R epigastric Irregular brown macule 0.8cm     neck Fleshy, skin-colored pedunculated papules.     Assessment & Plan   Lentigines - Scattered tan macules - Due to sun exposure - Benign-appering, observe - Recommend daily broad spectrum sunscreen SPF 30+ to sun-exposed areas, reapply every 2 hours as needed. - Call for any changes  Actinic Damage - chronic, secondary to cumulative UV radiation exposure/sun exposure over time - diffuse scaly erythematous macules with underlying dyspigmentation - Recommend daily broad spectrum sunscreen SPF 30+ to sun-exposed areas, reapply every 2 hours as needed.  - Recommend staying in the shade or wearing long sleeves, sun glasses (UVA+UVB protection) and wide brim hats (4-inch brim around the entire circumference of the hat). - Call for new or changing lesions.   Melanocytic Nevi - Tan-brown and/or pink-flesh-colored symmetric macules and papules - Benign  appearing on exam today - Observation - Call clinic for new or changing moles - Recommend daily use of broad spectrum spf 30+ sunscreen to sun-exposed areas.    Neoplasm of skin (3) Left Upper Back paraspinal Epidermal / dermal shaving  Lesion diameter (cm):  1.1 Informed consent: discussed and consent obtained   Timeout: patient name, date of birth, surgical site, and procedure verified   Procedure prep:  Patient was prepped and draped in usual sterile fashion Prep type:  Isopropyl alcohol Anesthesia: the lesion was anesthetized in a standard fashion   Anesthetic:  1% lidocaine w/ epinephrine 1-100,000 buffered w/ 8.4% NaHCO3 Instrument used: flexible razor blade   Hemostasis achieved with: pressure, aluminum chloride and electrodesiccation   Outcome: patient tolerated procedure well   Post-procedure details: sterile dressing applied and wound care instructions given   Dressing type: bandage and petrolatum    L mid back paraspinal Epidermal / dermal shaving  Lesion diameter (cm):  0.8 Informed consent: discussed and consent obtained   Timeout: patient name, date of birth, surgical site, and procedure verified   Procedure prep:  Patient was prepped and draped in usual sterile fashion Prep type:  Isopropyl alcohol Anesthesia: the lesion was anesthetized in a standard fashion   Anesthetic:  1% lidocaine w/ epinephrine 1-100,000 buffered w/ 8.4% NaHCO3 Instrument used: flexible razor blade   Hemostasis achieved with: pressure, aluminum chloride and electrodesiccation   Outcome: patient tolerated procedure well   Post-procedure details: sterile dressing applied and wound care instructions given   Dressing  type: bandage and bacitracin    R epigastric Epidermal / dermal shaving  Lesion diameter (cm):  0.8 Informed consent: discussed and consent obtained   Timeout: patient name, date of birth, surgical site, and procedure verified   Procedure prep:  Patient was prepped and draped  in usual sterile fashion Prep type:  Isopropyl alcohol Anesthesia: the lesion was anesthetized in a standard fashion   Anesthetic:  1% lidocaine w/ epinephrine 1-100,000 buffered w/ 8.4% NaHCO3 Instrument used: flexible razor blade   Hemostasis achieved with: pressure, aluminum chloride and electrodesiccation   Outcome: patient tolerated procedure well   Post-procedure details: sterile dressing applied and wound care instructions given   Dressing type: bandage and petrolatum    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 Related Procedures Anatomic Pathology Report  Skin tag neck Benign Discussed cosmetic snip excision, $115 for up to 15  Return in about 1 month (around 02/08/2022) for skin tags, 1 yr UBSE.  I, Sonya Hupman, RMA, am acting as scribe for Sarina Ser, MD . Documentation: I have reviewed the above documentation for accuracy and completeness, and I agree with the above.  Sarina Ser, MD

## 2022-01-08 NOTE — Patient Instructions (Addendum)

## 2022-01-12 ENCOUNTER — Encounter: Payer: Self-pay | Admitting: Dermatology

## 2022-01-16 LAB — ANATOMIC PATHOLOGY REPORT

## 2022-01-19 ENCOUNTER — Telehealth: Payer: Self-pay

## 2022-01-19 NOTE — Telephone Encounter (Signed)
-----   Message from Ralene Bathe, MD sent at 01/18/2022  5:54 PM EDT ----- Diagnosis synopsis: Comment  Comment: Specimen A-Skin Excision, left upper back paraspinal:  LENTIGINOUS COMPOUND MELANOCYTIC NEVUS. THE MARGINS APPEAR  FREE OF INVOLVEMENT.  Specimen B-Skin Excision, left mid back paraspinal:  LENTIGINOUS COMPOUND MELANOCYTIC NEVUS. THE MARGINS APPEAR  FREE OF INVOLVEMENT.  Specimen C-Skin Excision, right epigastric: LENTIGINOUS  COMPOUND MELANOCYTIC NEVUS. THE LESION EXTENDS TO THE BASE  OF THE SPECIMEN.    A,B,C - all 3 benign mole No further treatment needed Recheck next visit

## 2022-01-19 NOTE — Telephone Encounter (Signed)
Called pt discussed biopsy results  

## 2022-02-04 ENCOUNTER — Ambulatory Visit: Payer: Self-pay | Admitting: Dermatology

## 2022-02-04 DIAGNOSIS — L918 Other hypertrophic disorders of the skin: Secondary | ICD-10-CM

## 2022-02-04 NOTE — Patient Instructions (Signed)
Due to recent changes in healthcare laws, you may see results of your pathology and/or laboratory studies on MyChart before the doctors have had a chance to review them. We understand that in some cases there may be results that are confusing or concerning to you. Please understand that not all results are received at the same time and often the doctors may need to interpret multiple results in order to provide you with the best plan of care or course of treatment. Therefore, we ask that you please give us 2 business days to thoroughly review all your results before contacting the office for clarification. Should we see a critical lab result, you will be contacted sooner.   If You Need Anything After Your Visit  If you have any questions or concerns for your doctor, please call our main line at [phone] and press option 4 to reach your doctor's medical assistant. If no one answers, please leave a voicemail as directed and we will return your call as soon as possible. Messages left after 4 pm will be answered the following business day.   You may also send us a message via MyChart. We typically respond to MyChart messages within 1-2 business days.  For prescription refills, please ask your pharmacy to contact our office. Our fax number is [phone].  If you have an urgent issue when the clinic is closed that cannot wait until the next business day, you can page your doctor at the number below.    Please note that while we do our best to be available for urgent issues outside of office hours, we are not available 24/7.   If you have an urgent issue and are unable to reach us, you may choose to seek medical care at your doctor's office, retail clinic, urgent care center, or emergency room.  If you have a medical emergency, please immediately call 911 or go to the emergency department.  Pager Numbers  - Dr. Kowalski: [phone]  - Dr. Moye: [phone]  - Dr. Stewart:  [phone]  In the event of inclement weather, please call our main line at [phone] for an update on the status of any delays or closures.  Dermatology Medication Tips: Please keep the boxes that topical medications come in in order to help keep track of the instructions about where and how to use these. Pharmacies typically print the medication instructions only on the boxes and not directly on the medication tubes.   If your medication is too expensive, please contact our office at [phone] option 4 or send us a message through MyChart.   We are unable to tell what your co-pay for medications will be in advance as this is different depending on your insurance coverage. However, we may be able to find a substitute medication at lower cost or fill out paperwork to get insurance to cover a needed medication.   If a prior authorization is required to get your medication covered by your insurance company, please allow us 1-2 business days to complete this process.  Drug prices often vary depending on where the prescription is filled and some pharmacies may offer cheaper prices.  The website www.goodrx.com contains coupons for medications through different pharmacies. The prices here do not account for what the cost may be with help from insurance (it may be cheaper with your insurance), but the website can give you the price if you did not use any insurance.  - You can print the associated coupon and take it with   your prescription to the pharmacy.  - You may also stop by our office during regular business hours and pick up a GoodRx coupon card.  - If you need your prescription sent electronically to a different pharmacy, notify our office through [facility] MyChart or by phone at [phone] option 4.     Si Usted Necesita Algo Despus de Su Visita  Tambin puede enviarnos un mensaje a travs de MyChart. Por lo general respondemos a los mensajes de MyChart en el transcurso de 1 a 2  das hbiles.  Para renovar recetas, por favor pida a su farmacia que se ponga en contacto con nuestra oficina. Nuestro nmero de fax es el [phone].  Si tiene un asunto urgente cuando la clnica est cerrada y que no puede esperar hasta el siguiente da hbil, puede llamar/localizar a su doctor(a) al nmero que aparece a continuacin.   Por favor, tenga en cuenta que aunque hacemos todo lo posible para estar disponibles para asuntos urgentes fuera del horario de oficina, no estamos disponibles las 24 horas del da, los 7 das de la semana.   Si tiene un problema urgente y no puede comunicarse con nosotros, puede optar por buscar atencin mdica  en el consultorio de su doctor(a), en una clnica privada, en un centro de atencin urgente o en una sala de emergencias.  Si tiene una emergencia mdica, por favor llame inmediatamente al 911 o vaya a la sala de emergencias.  Nmeros de bper  - Dr. Kowalski: [phone]  - Dra. Moye: [phone]  - Dra. Stewart: [phone]  En caso de inclemencias del tiempo, por favor llame a nuestra lnea principal al [phone] para una actualizacin sobre el estado de cualquier retraso o cierre.  Consejos para la medicacin en dermatologa: Por favor, guarde las cajas en las que vienen los medicamentos de uso tpico para ayudarle a seguir las instrucciones sobre dnde y cmo usarlos. Las farmacias generalmente imprimen las instrucciones del medicamento slo en las cajas y no directamente en los tubos del medicamento.   Si su medicamento es muy caro, por favor, pngase en contacto con nuestra oficina llamando al [phone] y presione la opcin 4 o envenos un mensaje a travs de MyChart.   No podemos decirle cul ser su copago por los medicamentos por adelantado ya que esto es diferente dependiendo de la cobertura de su seguro. Sin embargo, es posible que podamos encontrar un medicamento sustituto a menor costo o llenar un formulario para que el  seguro cubra el medicamento que se considera necesario.   Si se requiere una autorizacin previa para que su compaa de seguros cubra su medicamento, por favor permtanos de 1 a 2 das hbiles para completar este proceso.  Los precios de los medicamentos varan con frecuencia dependiendo del lugar de dnde se surte la receta y alguna farmacias pueden ofrecer precios ms baratos.  El sitio web www.goodrx.com tiene cupones para medicamentos de diferentes farmacias. Los precios aqu no tienen en cuenta lo que podra costar con la ayuda del seguro (puede ser ms barato con su seguro), pero el sitio web puede darle el precio si no utiliz ningn seguro.  - Puede imprimir el cupn correspondiente y llevarlo con su receta a la farmacia.  - Tambin puede pasar por nuestra oficina durante el horario de atencin regular y recoger una tarjeta de cupones de GoodRx.  - Si necesita que su receta se enve electrnicamente a una farmacia diferente, informe a nuestra oficina a travs de MyChart de [place]   o por telfono llamando al [phone] y presione la opcin 4.  

## 2022-02-04 NOTE — Progress Notes (Unsigned)
   Follow-Up Visit   Subjective  Kevin Davidson is a 41 y.o. male who presents for the following: Skin Tag (Neck and underarms that he would like removed today).    The following portions of the chart were reviewed this encounter and updated as appropriate:       Review of Systems:  No other skin or systemic complaints except as noted in HPI or Assessment and Plan.  Objective  Well appearing patient in no apparent distress; mood and affect are within normal limits.  A focused examination was performed including neck, axilla. Relevant physical exam findings are noted in the Assessment and Plan.  Neck, axilla Fleshy, skin-colored pedunculated papules.      Assessment & Plan  Skin tag Neck, axilla  Acrochordons (Skin Tags) - Removal desired by patient - Fleshy, skin-colored pedunculated papules - Benign appearing.  - Patient desires removal. Reviewed that this is not covered by insurance and they will be charged a cosmetic fee for removal. Patient signed non-covered consent.  - Prior to the procedure, reviewed the expected small wound. Also reviewed the risk of leaving a small scar and the small risk of infection.  PROCEDURE - The areas were prepped with isopropyl alcohol. A small amount of lidocaine 1% with epinephrine was injected at the base of each lesion to achieve good local anesthesia. The skin tags were removed using a snip technique. Aluminum chloride was used for hemostasis. Petrolatum and a bandage were applied. The procedure was tolerated well. - Wound care was reviewed with the patient. They were advised to call with any concerns. Total number of treated acrochordons - 9   Return for Follow up as scheduled.  I, Ashok Cordia, CMA, am acting as scribe for Sarina Ser, MD .

## 2022-02-06 ENCOUNTER — Encounter: Payer: Self-pay | Admitting: Dermatology

## 2022-03-23 ENCOUNTER — Ambulatory Visit: Payer: No Typology Code available for payment source | Admitting: Dermatology

## 2022-04-27 IMAGING — CT CT RENAL STONE PROTOCOL
2 of 4 series · 17 of 46 positions shown, 19 images · non-contrast
Comparison: 06/13/2020

CLINICAL DATA: Right flank pain

EXAM:
CT ABDOMEN AND PELVIS WITHOUT CONTRAST
TECHNIQUE: Multidetector CT imaging of the abdomen and pelvis was performed
following the standard protocol without IV contrast.

[Series 2: stone full standard · axial · 0.96mm/px · z∈[-536,-36]mm · 14 of 110 slices shown, 16 images]
[im 5/110  soft-tissue]
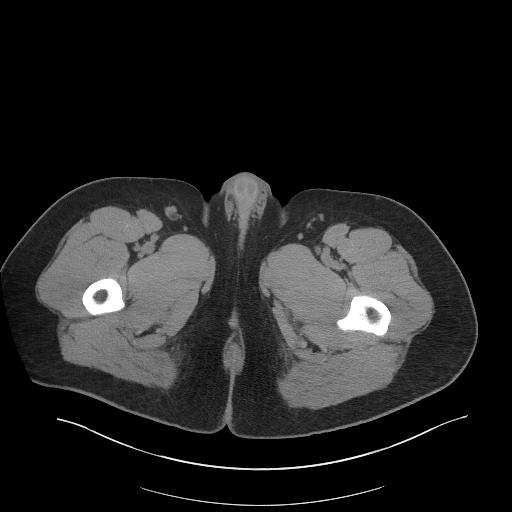
[im 5/110  bone]
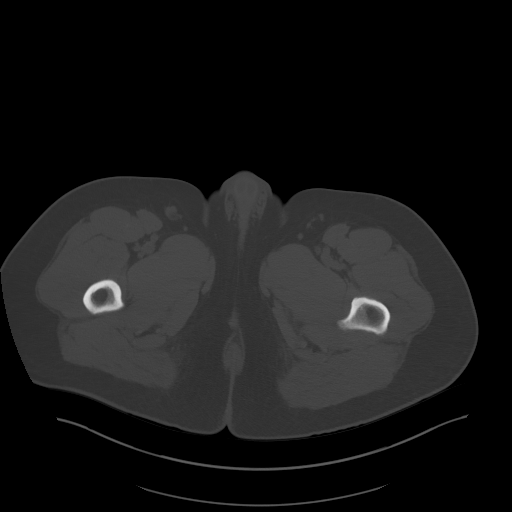
[im 14/110  soft-tissue]
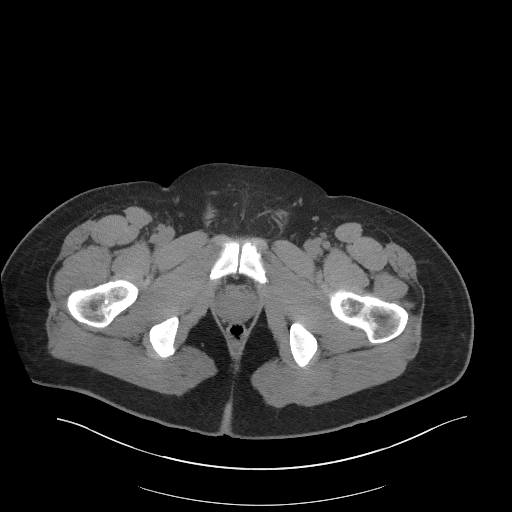
[im 22/110  soft-tissue]
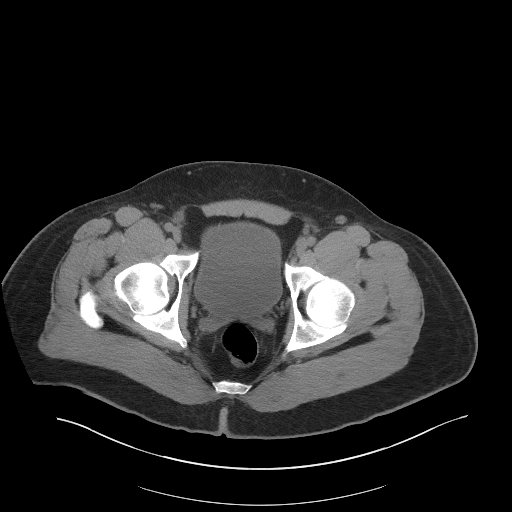
[im 31/110  soft-tissue]
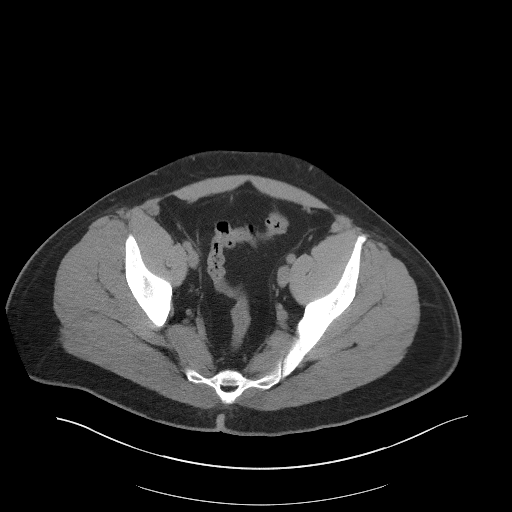
[im 35/110  soft-tissue]
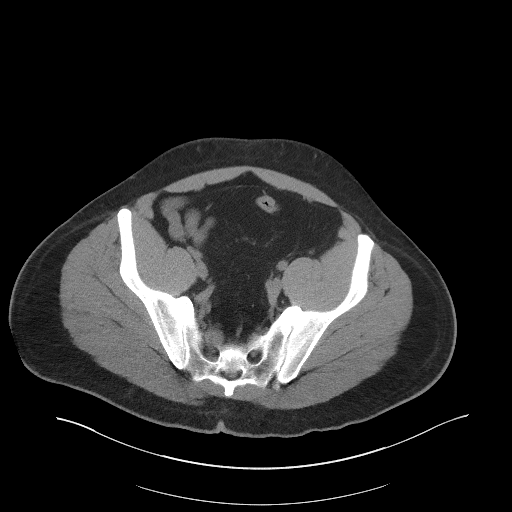
[im 44/110  soft-tissue]
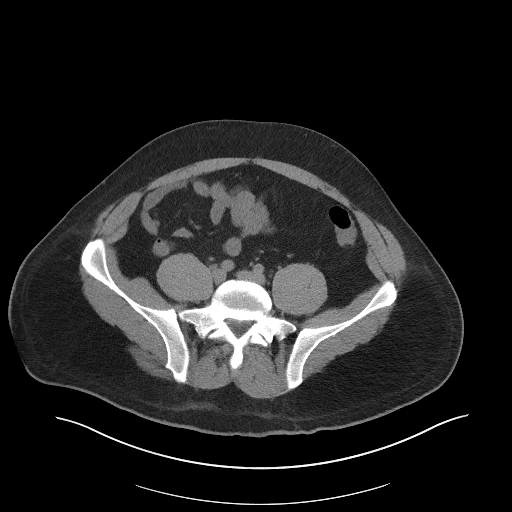
[im 53/110  soft-tissue]
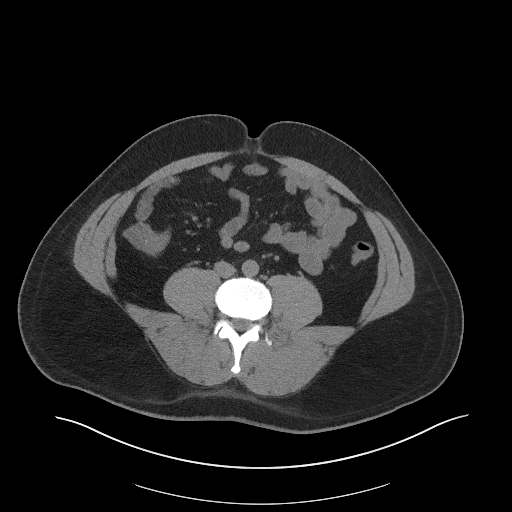
[im 57/110  soft-tissue]
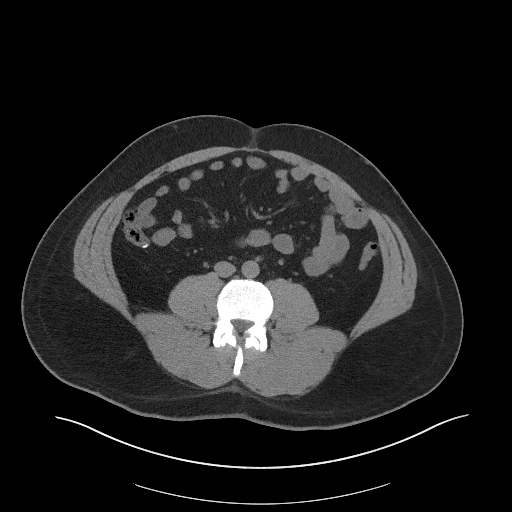
[im 66/110  soft-tissue]
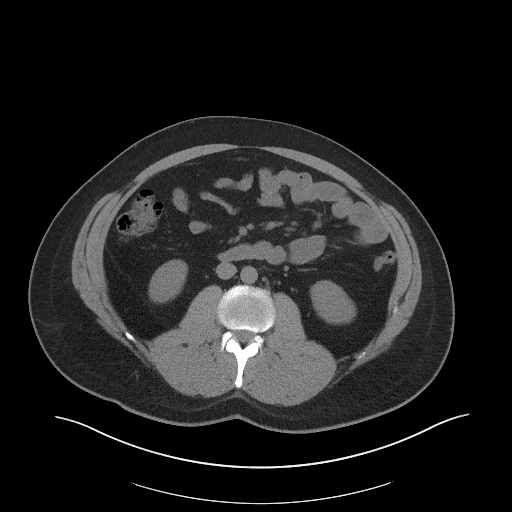
[im 66/110  bone]
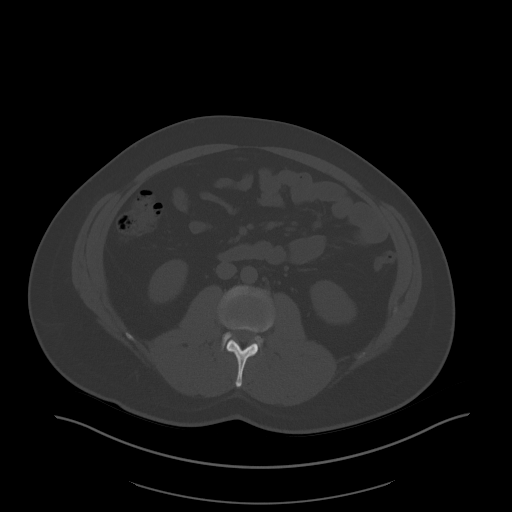
[im 75/110  soft-tissue]
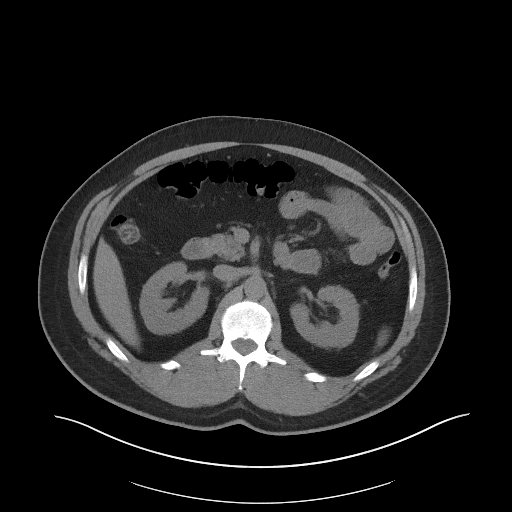
[im 83/110  soft-tissue]
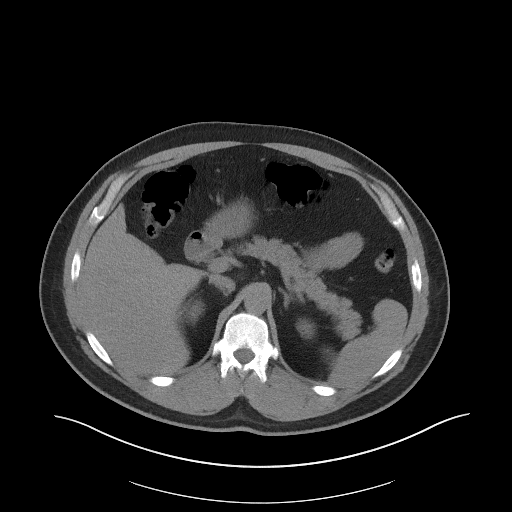
[im 88/110  soft-tissue]
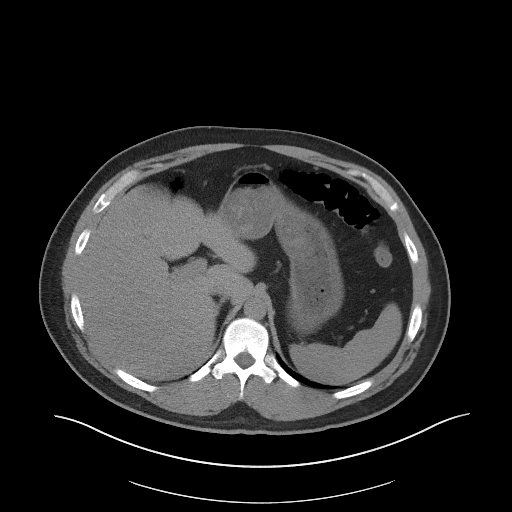
[im 96/110  soft-tissue]
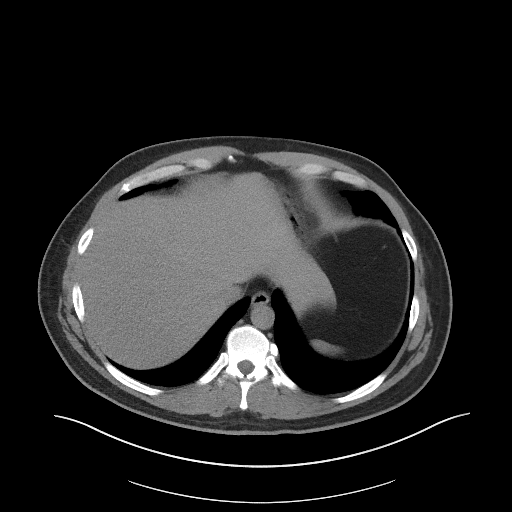
[im 105/110  soft-tissue]
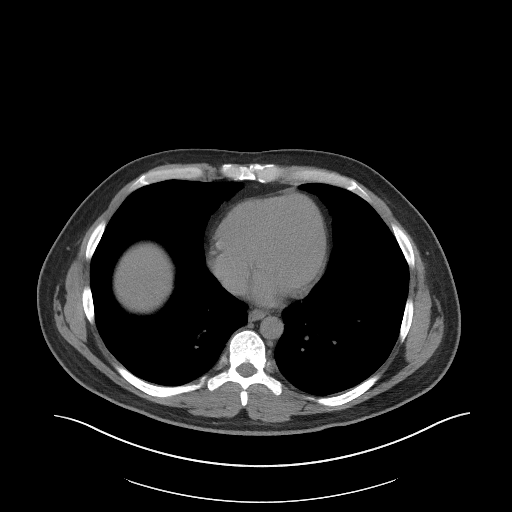

[Series 5: coronal · coronal · 0.90mm/px · 3 of 148 slices shown]
[im 50/148  soft-tissue]
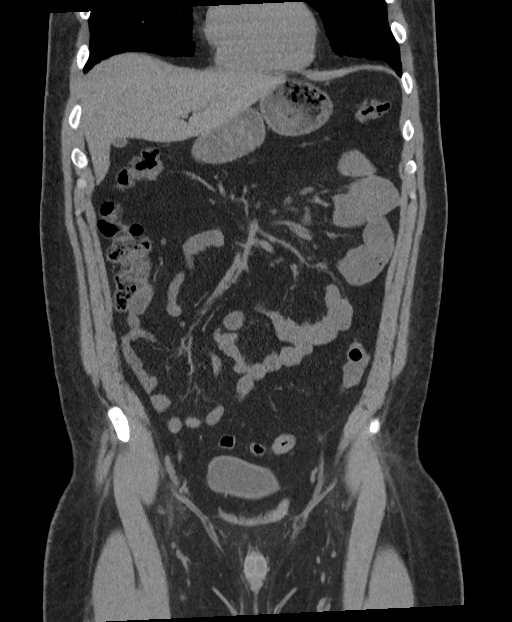
[im 66/148  soft-tissue]
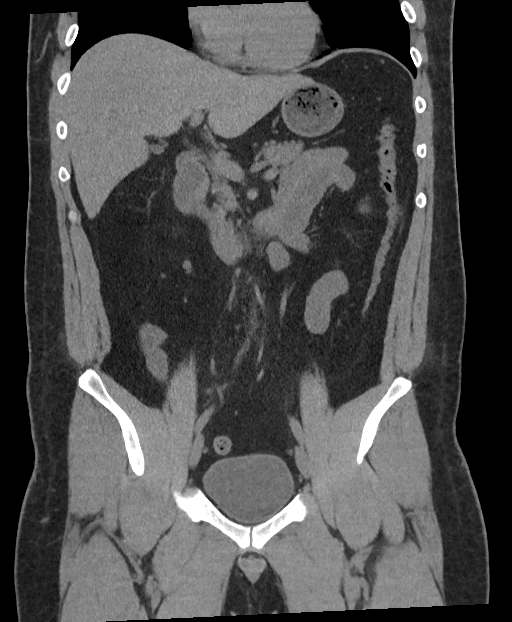
[im 82/148  soft-tissue]
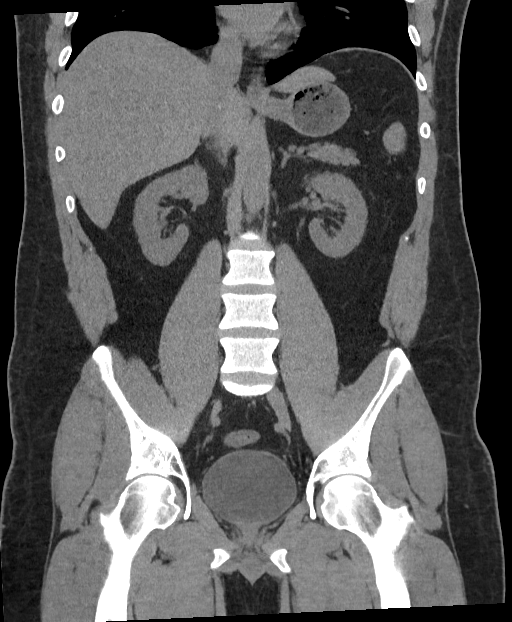

[17 of 46 positions shown; findings below may reference images not displayed]

FINDINGS: Lower chest: Lung bases are clear. No effusions. Heart is normal
size.

Hepatobiliary: No focal hepatic abnormality. Gallbladder
unremarkable.

Pancreas: No focal abnormality or ductal dilatation.

Spleen: No focal abnormality.  Normal size.

Adrenals/Urinary Tract: No adrenal abnormality. No focal renal
abnormality. No stones or hydronephrosis. Urinary bladder is
unremarkable.

Stomach/Bowel: Prior appendectomy. Stomach, large and small bowel
grossly unremarkable.

Vascular/Lymphatic: No evidence of aneurysm or adenopathy.

Reproductive: No visible focal abnormality.

Other: No free fluid or free air.

Musculoskeletal: No acute bony abnormality.
IMPRESSION: No renal or ureteral stones.  No hydronephrosis.

No acute findings.

## 2023-01-13 ENCOUNTER — Ambulatory Visit: Payer: No Typology Code available for payment source | Admitting: Dermatology

## 2023-05-06 ENCOUNTER — Ambulatory Visit
Admission: RE | Admit: 2023-05-06 | Discharge: 2023-05-06 | Disposition: A | Discharge status: Home / Self Care | Payer: No Typology Code available for payment source | Source: Ambulatory Visit | Attending: Physician Assistant | Admitting: Physician Assistant

## 2023-05-06 ENCOUNTER — Other Ambulatory Visit: Payer: Self-pay | Admitting: Physician Assistant

## 2023-05-06 DIAGNOSIS — M25571 Pain in right ankle and joints of right foot: Secondary | ICD-10-CM | POA: Insufficient documentation
